# Patient Record
Sex: Male | Born: 2000 | Race: White | Hispanic: No | Marital: Single | Smoking: Never smoker
Health system: Southern US, Community
[De-identification: ages and names within clinical notes are randomized; demographics above are authoritative.]

## PROBLEM LIST (undated history)

## (undated) DIAGNOSIS — R51 Headache: Secondary | ICD-10-CM

## (undated) HISTORY — DX: Headache: R51

---
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
## 2020-10-18 ENCOUNTER — Ambulatory Visit
Admission: EM | Admit: 2020-10-18 | Discharge: 2020-10-18 | Disposition: A | Discharge status: Home / Self Care | Payer: Medicaid Other | Attending: Emergency Medicine | Admitting: Emergency Medicine

## 2020-10-18 ENCOUNTER — Other Ambulatory Visit: Payer: Self-pay

## 2020-10-18 DIAGNOSIS — J069 Acute upper respiratory infection, unspecified: Secondary | ICD-10-CM | POA: Diagnosis not present

## 2020-10-18 MED ORDER — FLUTICASONE PROPIONATE 50 MCG/ACT NA SUSP: 2.0000 | Freq: Every day | NASAL | 0 refills | Status: DC

## 2020-10-18 NOTE — Discharge Instructions (Addendum)
start an antihistamine/decongestant combination such as Claritin-D, Allegra-D, Zyrtec-D, Flonase, saline nasal irrigation with a Lloyd Huger med rinse and distilled water as often as you want.  This will help reduce the postnasal drip and nasal congestion.  Follow-up with a primary care physician of your choice.  See list below.  If you change your mind about Covid testing, feel free to return here and we can do it at that time.  Here is a list of primary care providers who are taking new patients:  Dr. Elizabeth Sauer [address] [place] [address] [phone]  [facility] [address] [place], [place] [address] [phone]  [facility] [place] [address] [place] [address]  [phone]  [facility] [address] [place], [place] [address] [phone]  [facility] [address] [place]  [phone] [place], [place] [address]  Here are clinics/ other resources who will see you if you do not have insurance. Some have certain criteria that you must meet. Call them and find out what they are:  [facility]: [address] [place], [place] [address] Phone: [phone] Hours: First and Third Saturdays of each Month, 9 a.m. - 1 p.m.  [facility]: [address], [address] [place], [place] [address] Phone: [phone] Hours: Tuesday, 4 p.m. - 8 p.m. Thursday, 1 p.m. - 8 p.m. Wednesday, 9 a.m. - [facility] [address], [place], [place] [address] Phone: [phone] Pharmacy Phone Number: [phone] Dental Phone Number: [phone] [facility] Insurance Help: [phone]  Dental Hours: Monday - Thursday, 8 a.m. - 6 p.m.  [facility] [address], [place], [place] [address] Phone: [phone] Pharmacy Phone Number: [phone] [facility] Insurance Help: [phone]  [facility] [address] [place] [place], [place] [address] Phone:  [phone] Pharmacy Phone Number: [phone] [facility] Insurance Help: [phone]  [facility] [address] [place], [place] [address] Phone: [phone] [facility] Insurance Help: [phone]   [facility] [address] [place], [place] [address] Phone: [phone]  Go to www.goodrx.com to look up your medications. This will give you a list of where you can find your prescriptions at the most affordable prices. Or ask the pharmacist what the cash price is, or if they have any other discount programs available to help make your medication more affordable. This can be less expensive than what you would pay with insurance.  Marland Kitchen

## 2020-10-18 NOTE — ED Triage Notes (Signed)
Pt cough productive cough with green sputum, congestion, runny nose for approx 4 days.   Denies SOB, sore throat, body aches, n/v/d, abdominal pain, loss of taste/smell. No OTC meds taken for symptoms. Pt declines covid testing.

## 2020-10-18 NOTE — ED Provider Notes (Addendum)
HPI  SUBJECTIVE:  Marc Berry is a 19 y.o. male who presents with clear nasal congestion, postnasal drip, sore throat secondary to the postnasal drip for the past 3 to 4 days.  He reports a cough because of the postnasal drip and sinus pressure when blowing his nose.  There is no other sinus pain or pressure.  No fevers, body aches, headaches, loss of sense of smell or taste, shortness of breath, nausea, vomiting, diarrhea, abdominal pain.  No known Covid exposure.  He did not get the Covid vaccine.  No allergy type symptoms, but he states that he gets these symptoms around this time every year and it usually responds well to an unknown allergy pill.  No facial swelling, upper dental pain.  No antibiotics in the past month. no Antipyretic in the past 6 hours.  He tried Advil 400 mg with improvement in his symptoms.  No aggravating factors.  He has a past medical history of seasonal allergies.  No history of frequent sinusitis, Covid, diabetes, hypertension.  PMD: None.    Past Medical History:  Diagnosis Date  . Headache(784.0)     History reviewed. No pertinent surgical history.  History reviewed. No pertinent family history.  Social History   Tobacco Use  . Smoking status: Never Smoker  . Smokeless tobacco: Never Used  Vaping Use  . Vaping Use: Never used  Substance Use Topics  . Alcohol use: No  . Drug use: No    No current facility-administered medications for this encounter.  Current Outpatient Medications:  .  fluticasone (FLONASE) 50 MCG/ACT nasal spray, Place 2 sprays into both nostrils daily., Disp: 16 g, Rfl: 0 .  hydrocortisone 2.5 % cream, Apply topically 3 (three) times daily., Disp: 30 g, Rfl: 0 .  methylphenidate (METADATE CD) 30 MG CR capsule, Take 30 mg by mouth every morning., Disp: , Rfl:   No Known Allergies   ROS  As noted in HPI.   Physical Exam  BP (!) 141/92 (BP Location: Left Arm)   Pulse 81   Temp 99.5 F (37.5 C) (Oral)   Resp 16   SpO2  98%   Constitutional: Well developed, well nourished, no acute distress Eyes:  EOMI, conjunctiva normal bilaterally HENT: Normocephalic, atraumatic,mucus membranes moist.  Swollen, erythematous turbinates.  Extensive clear nasal congestion.  No maxillary, frontal sinus tenderness.  No obvious postnasal drip.  Normal oropharynx. Respiratory: Normal inspiratory effort, lungs clear bilaterally, good air movement. Cardiovascular: Normal rate regular rhythm no murmurs rubs or gallops GI: nondistended skin: No rash, skin intact Musculoskeletal: no deformities Neurologic: Alert & oriented x 3, no focal neuro deficits Psychiatric: Speech and behavior appropriate   ED Course   Medications - No data to display  No orders of the defined types were placed in this encounter.   No results found for this or any previous visit (from the past 24 hour(s)). No results found.  ED Clinical Impression  1. Upper respiratory tract infection, unspecified type      ED Assessment/Plan  Patient states that this happens every year, around this time of the year/with change in seasons and that an antihistamine usually works.  Advised him to start on antihistamine/decongestant combination such as Claritin-D, Allegra-D, Zyrtec-D.  Will start Flonase, saline nasal irrigation.  No indications for antibiotics at this time.  Discussed that with patient.  Also encouraged patient to get a Covid test. Discussed with him that it would change management that he would be a candidate for monoclonal  antibody infusion based on BMI, and that it would keep other people safe, but patient declined.  Will give primary care list for ongoing care.   Discussed , MDM, treatment plan, and plan for follow-up with patient. patient agrees with plan.   Meds ordered this encounter  Medications  . fluticasone (FLONASE) 50 MCG/ACT nasal spray    Sig: Place 2 sprays into both nostrils daily.    Dispense:  16 g    Refill:  0    *This  clinic note was created using Scientist, clinical (histocompatibility and immunogenetics). Therefore, there may be occasional mistakes despite careful proofreading.   ?    Domenick Gong, MD 10/18/20 6767    Domenick Gong, MD 10/18/20 [phone]

## 2020-10-23 ENCOUNTER — Encounter: Payer: Self-pay | Admitting: Emergency Medicine

## 2020-10-23 ENCOUNTER — Other Ambulatory Visit: Payer: Self-pay

## 2020-10-23 ENCOUNTER — Ambulatory Visit
Admission: EM | Admit: 2020-10-23 | Discharge: 2020-10-23 | Disposition: A | Discharge status: Home / Self Care | Payer: Medicaid Other | Attending: Family Medicine | Admitting: Family Medicine

## 2020-10-23 DIAGNOSIS — J310 Chronic rhinitis: Secondary | ICD-10-CM | POA: Insufficient documentation

## 2020-10-23 DIAGNOSIS — J36 Peritonsillar abscess: Secondary | ICD-10-CM | POA: Diagnosis not present

## 2020-10-23 DIAGNOSIS — Z79899 Other long term (current) drug therapy: Secondary | ICD-10-CM | POA: Diagnosis not present

## 2020-10-23 DIAGNOSIS — J029 Acute pharyngitis, unspecified: Secondary | ICD-10-CM | POA: Diagnosis present

## 2020-10-23 DIAGNOSIS — J039 Acute tonsillitis, unspecified: Secondary | ICD-10-CM | POA: Diagnosis not present

## 2020-10-23 DIAGNOSIS — Z20822 Contact with and (suspected) exposure to covid-19: Secondary | ICD-10-CM | POA: Diagnosis not present

## 2020-10-23 LAB — SARS CORONAVIRUS 2 AG (30 MIN TAT): SARS Coronavirus 2 Ag: NEGATIVE

## 2020-10-23 LAB — GROUP A STREP BY PCR: Group A Strep by PCR: NOT DETECTED

## 2020-10-23 MED ORDER — METHYLPREDNISOLONE 4 MG PO TBPK: ORAL_TABLET | ORAL | 0 refills | Status: DC

## 2020-10-23 MED ORDER — AMOXICILLIN-POT CLAVULANATE 875-125 MG PO TABS: 1.0000 | ORAL_TABLET | Freq: Two times a day (BID) | ORAL | 0 refills | Status: DC

## 2020-10-23 NOTE — Discharge Instructions (Addendum)
You should improve in the next 48-72h. If you get worse, go to ER since you may have to see the on call Ear Nose and throat doctor.

## 2020-10-23 NOTE — ED Provider Notes (Signed)
[facility]    CSN: [id number] Arrival date & time: 10/23/20  1832      History   Chief Complaint Chief Complaint  Patient presents with  . Sore Throat  . Nasal Congestion    HPI Marc Berry is a 19 y.o. male who presents with persistent ST, but worse on the L than the R. His nose congestion is better with Flonase. Has been having discolored nose congestion and PND x 1 month. Yesterday he brew green mucous from his nose. He was chilling and sweating last night and did not know he had a fever today. Has been tired. Has mild cough.     Past Medical History:  Diagnosis Date  . Headache(784.0)     There are no problems to display for this patient.   History reviewed. No pertinent surgical history.     Home Medications    Prior to Admission medications   Medication Sig Start Date End Date Taking? Authorizing Provider  fluticasone (FLONASE) 50 MCG/ACT nasal spray Place 2 sprays into both nostrils daily. 10/18/20  Yes Domenick Gong, MD  amoxicillin-clavulanate (AUGMENTIN) 875-125 MG tablet Take 1 tablet by mouth every 12 (twelve) hours. 10/23/20   Rodriguez-Southworth, Nettie Elm, PA-C  methylPREDNISolone (MEDROL DOSEPAK) 4 MG TBPK tablet Take as directed 10/23/20   Rodriguez-Southworth, Nettie Elm, PA-C  methylphenidate (METADATE CD) 30 MG CR capsule Take 30 mg by mouth every morning.  10/23/20  [provider]  metoCLOPramide (REGLAN) 10 MG tablet Take 1 tablet (10 mg total) by mouth every 6 (six) hours as needed for nausea. 07/26/15 10/18/20  Charlynne Pander, MD  SUMAtriptan (IMITREX) 50 MG tablet Take 50 mg by mouth. Take one at onset of migraine with 400 mg of ibuprofen may repeat in 2 hours if migraine persists  10/18/20  [provider]    Family History History reviewed. No pertinent family history.  Social History Social History   Tobacco Use  . Smoking status: Never Smoker  . Smokeless tobacco: Never Used  Vaping Use  .  Vaping Use: Never used  Substance Use Topics  . Alcohol use: No  . Drug use: No     Allergies   Patient has no known allergies.   Review of Systems Review of Systems  Constitutional: Positive for chills, diaphoresis, fatigue and fever. Negative for appetite change.  HENT: Positive for postnasal drip, sore throat and trouble swallowing. Negative for congestion, ear discharge, ear pain, mouth sores, rhinorrhea and sinus pressure.   Eyes: Negative for discharge.  Respiratory: Positive for cough. Negative for shortness of breath and wheezing.   Gastrointestinal: Negative for abdominal pain.  Musculoskeletal: Negative for gait problem, joint swelling and myalgias.  Skin: Negative for rash.  Neurological: Positive for headaches.  Hematological: Positive for adenopathy.   Physical Exam Triage Vital Signs ED Triage Vitals  Enc Vitals Group     BP 10/23/20 1847 (!) 133/100     Pulse Rate 10/23/20 1847 100     Resp 10/23/20 1847 18     Temp 10/23/20 1847 (!) 101 F (38.3 C)     Temp Source 10/23/20 1847 Oral     SpO2 10/23/20 1847 100 %     Weight 10/23/20 1844 250 lb (113.4 kg)     Height 10/23/20 1844 6' (1.829 m)     Head Circumference --      Peak Flow --      Pain Score 10/23/20 1844 10     Pain Loc --  Pain Edu? --      Excl. in GC? --    No data found.  Updated Vital Signs BP (!) 133/100 (BP Location: Left Arm)   Pulse 100   Temp (!) 101 F (38.3 C) (Oral)   Resp 18   Ht 6' (1.829 m)   Wt 250 lb (113.4 kg)   SpO2 100%   BMI 33.91 kg/m   Visual Acuity Right Eye Distance:   Left Eye Distance:   Bilateral Distance:    Right Eye Near:   Left Eye Near:    Bilateral Near:     Physical Exam Vitals and nursing note reviewed.  Constitutional:      General: He is not in acute distress.    Appearance: He is obese. He is not toxic-appearing.  HENT:     Head: Normocephalic.     Right Ear: Tympanic membrane and ear canal normal.     Left Ear: Tympanic  membrane and ear canal normal.     Mouth/Throat:     Mouth: Mucous membranes are moist.     Pharynx: Oropharyngeal exudate and posterior oropharyngeal erythema present.     Tonsils: Tonsillar exudate and tonsillar abscess present. 3+ on the right. 3+ on the left.     Comments: Has mild uvula deviation Eyes:     Conjunctiva/sclera: Conjunctivae normal.  Cardiovascular:     Rate and Rhythm: Normal rate and regular rhythm.     Heart sounds: No murmur heard.   Pulmonary:     Effort: Pulmonary effort is normal.     Breath sounds: Normal breath sounds.  Musculoskeletal:     Cervical back: Neck supple.  Lymphadenopathy:     Cervical: Cervical adenopathy present.  Skin:    General: Skin is warm and dry.     Findings: No rash.  Neurological:     Mental Status: He is alert and oriented to person, place, and time.  Psychiatric:        Mood and Affect: Mood normal.        Behavior: Behavior normal.     UC Treatments / Results  Labs (all labs ordered are listed, but only abnormal results are displayed) Labs Reviewed  GROUP A STREP BY PCR  SARS CORONAVIRUS 2 AG (30 MIN TAT)  Rapid Covid and Strep are negative  EKG   Radiology No results found.  Procedures Procedures (including critical care time)  Medications Ordered in UC Medications - No data to display  Initial Impression / Assessment and Plan / UC Course  I have reviewed the triage vital signs and the nursing notes. He has exudative tonsillitis and possibly early peritonsillar abscess on the L I palced him on Medrol dose pack and Augmentin as noted. See instructions.  Pertinent labs  results that were available during my care of the patient were reviewed by me and considered in my medical decision making (see chart for details).   Final Clinical Impressions(s) / UC Diagnoses   Final diagnoses:  Exudative tonsillitis  Peritonsillar abscess  Purulent rhinitis     Discharge Instructions     You should improve in  the next 48-72h. If you get worse, go to ER since you may have to see the on call Ear Nose and throat doctor.     ED Prescriptions    Medication Sig Dispense Auth. Provider   amoxicillin-clavulanate (AUGMENTIN) 875-125 MG tablet Take 1 tablet by mouth every 12 (twelve) hours. 20 tablet Rodriguez-Southworth, Nettie Elm, PA-C   methylPREDNISolone (MEDROL  DOSEPAK) 4 MG TBPK tablet Take as directed 21 tablet Rodriguez-Southworth, Nettie Elm, PA-C     PDMP not reviewed this encounter.   Garey Ham, [place] 10/23/20 1953

## 2020-10-23 NOTE — ED Triage Notes (Signed)
Patient c/o sore throat that started last night. He states he was seen 4 days ago for nasal congestion and cough which has improved.

## 2020-10-25 ENCOUNTER — Ambulatory Visit
Admission: EM | Admit: 2020-10-25 | Discharge: 2020-10-25 | Disposition: A | Discharge status: Home / Self Care | Payer: Medicaid Other | Attending: Family Medicine | Admitting: Family Medicine

## 2020-10-25 ENCOUNTER — Other Ambulatory Visit: Payer: Self-pay

## 2020-10-25 DIAGNOSIS — J029 Acute pharyngitis, unspecified: Secondary | ICD-10-CM

## 2020-10-25 MED ORDER — KETOROLAC TROMETHAMINE 10 MG PO TABS: 10.0000 mg | ORAL_TABLET | Freq: Four times a day (QID) | ORAL | 0 refills | Status: DC | PRN

## 2020-10-25 MED ORDER — LIDOCAINE VISCOUS HCL 2 % MT SOLN: OROMUCOSAL | 0 refills | Status: DC

## 2020-10-25 NOTE — ED Triage Notes (Signed)
Pt reports being seen 10/26 for sore throat. Strep test resulted negative. Medications were prescribed.

## 2020-10-25 NOTE — Discharge Instructions (Signed)
Rest.  Lots of fluids.  Take medications as prescribed.  If you worsen, go to the ER.  Take care  Dr. Adriana Simas

## 2020-10-25 NOTE — ED Provider Notes (Signed)
[facility]    CSN: [id number] Arrival date & time: 10/25/20  1503      History   Chief Complaint Chief Complaint  Patient presents with  . Sore Throat    HPI  19 year old male presents with sore throat.  Patient reports persistent sore throat.  Patient was seen on 10/26.  Strep PCR was negative.  He was placed on Augmentin and steroids for tonsillitis.  He states that he has failed to improve.  Pain 8/10 in severity.  No relieving factors.  He has been febrile but is currently afebrile.  Covid testing was negative as well.  Patient is concerned that he has strep pharyngitis would like to discuss testing today.  No other reported symptoms.  No other complaints.  Past Medical History:  Diagnosis Date  . Headache(784.0)    Home Medications    Prior to Admission medications   Medication Sig Start Date End Date Taking? Authorizing Provider  amoxicillin-clavulanate (AUGMENTIN) 875-125 MG tablet Take 1 tablet by mouth every 12 (twelve) hours. 10/23/20   Rodriguez-Southworth, Nettie Elm, PA-C  fluticasone (FLONASE) 50 MCG/ACT nasal spray Place 2 sprays into both nostrils daily. 10/18/20   Domenick Gong, MD  ketorolac (TORADOL) 10 MG tablet Take 1 tablet (10 mg total) by mouth every 6 (six) hours as needed for moderate pain or severe pain. 10/25/20   Tommie Sams, DO  lidocaine (XYLOCAINE) 2 % solution Gargle 15 mL every 3 hours as needed for sore throat. May swallow if desired. 10/25/20   Tommie Sams, DO  methylPREDNISolone (MEDROL DOSEPAK) 4 MG TBPK tablet Take as directed 10/23/20   Rodriguez-Southworth, Nettie Elm, PA-C  methylphenidate (METADATE CD) 30 MG CR capsule Take 30 mg by mouth every morning.  10/23/20  [provider]  metoCLOPramide (REGLAN) 10 MG tablet Take 1 tablet (10 mg total) by mouth every 6 (six) hours as needed for nausea. 07/26/15 10/18/20  Charlynne Pander, MD  SUMAtriptan (IMITREX) 50 MG tablet Take 50 mg by mouth. Take one at onset of  migraine with 400 mg of ibuprofen may repeat in 2 hours if migraine persists  10/18/20  [provider]    Family History History reviewed. No pertinent family history.  Social History Social History   Tobacco Use  . Smoking status: Never Smoker  . Smokeless tobacco: Never Used  Vaping Use  . Vaping Use: Never used  Substance Use Topics  . Alcohol use: No  . Drug use: No     Allergies   Patient has no known allergies.   Review of Systems Review of Systems  Constitutional: Positive for fever.  HENT: Positive for sore throat.    Physical Exam Triage Vital Signs ED Triage Vitals  Enc Vitals Group     BP 10/25/20 1510 (!) 132/97     Pulse Rate 10/25/20 1510 89     Resp 10/25/20 1510 16     Temp 10/25/20 1510 98.7 F (37.1 C)     Temp Source 10/25/20 1510 Oral     SpO2 10/25/20 1510 100 %     Weight 10/25/20 1514 250 lb (113.4 kg)     Height --      Head Circumference --      Peak Flow --      Pain Score 10/25/20 1510 8     Pain Loc --      Pain Edu? --      Excl. in GC? --    Updated Vital Signs BP Marland Kitchen)  132/97 (BP Location: Left Arm)   Pulse 89   Temp 98.7 F (37.1 C) (Oral)   Resp 16   Wt 113.4 kg   SpO2 100%   BMI 33.91 kg/m   Visual Acuity Right Eye Distance:   Left Eye Distance:   Bilateral Distance:    Right Eye Near:   Left Eye Near:    Bilateral Near:     Physical Exam Vitals and nursing note reviewed.  Constitutional:      Appearance: He is well-developed. He is obese.  HENT:     Head: Normocephalic and atraumatic.     Mouth/Throat:     Pharynx: Uvula midline. Posterior oropharyngeal erythema present.     Tonsils: Tonsillar exudate present. 2+ on the right. 2+ on the left.  Cardiovascular:     Rate and Rhythm: Normal rate and regular rhythm.     Heart sounds: No murmur heard.   Neurological:     Mental Status: He is alert.    UC Treatments / Results  Labs (all labs ordered are listed, but only abnormal results are  displayed) Labs Reviewed - No data to display  EKG   Radiology No results found.  Procedures Procedures (including critical care time)  Medications Ordered in UC Medications - No data to display  Initial Impression / Assessment and Plan / UC Course  I have reviewed the triage vital signs and the nursing notes.  Pertinent labs & imaging results that were available during my care of the patient were reviewed by me and considered in my medical decision making (see chart for details).    19 year old male presents with viral pharyngitis.  Discussed lack of need to repeat strep testing.  Advised to complete antibiotic course.  I suspect that this is viral and most likely due to infectious mononucleosis.  Discussed testing and patient elected to just be treated symptomatically.  Treating with viscous lidocaine and Toradol.  Supportive care.  Final Clinical Impressions(s) / UC Diagnoses   Final diagnoses:  Viral pharyngitis     Discharge Instructions     Rest.  Lots of fluids.  Take medications as prescribed.  If you worsen, go to the ER.  Take care  Dr. Adriana Simas     ED Prescriptions    Medication Sig Dispense Auth. Provider   lidocaine (XYLOCAINE) 2 % solution Gargle 15 mL every 3 hours as needed for sore throat. May swallow if desired. 200 mL Shailen Thielen G, DO   ketorolac (TORADOL) 10 MG tablet Take 1 tablet (10 mg total) by mouth every 6 (six) hours as needed for moderate pain or severe pain. 20 tablet Tommie Sams, DO     PDMP not reviewed this encounter.   Tommie Sams, [place] 10/25/20 1725

## 2020-12-26 ENCOUNTER — Ambulatory Visit
Admission: EM | Admit: 2020-12-26 | Discharge: 2020-12-26 | Disposition: A | Discharge status: Home / Self Care | Payer: Medicaid Other | Attending: Family Medicine | Admitting: Family Medicine

## 2020-12-26 ENCOUNTER — Other Ambulatory Visit: Payer: Self-pay

## 2020-12-26 DIAGNOSIS — U071 COVID-19: Secondary | ICD-10-CM | POA: Diagnosis not present

## 2020-12-26 LAB — RESP PANEL BY RT-PCR (FLU A&B, COVID) ARPGX2
Influenza A by PCR: NEGATIVE
Influenza B by PCR: NEGATIVE
SARS Coronavirus 2 by RT PCR: POSITIVE — AB

## 2020-12-26 MED ORDER — IPRATROPIUM BROMIDE 0.06 % NA SOLN: 2.0000 | Freq: Four times a day (QID) | NASAL | 0 refills | Status: AC | PRN

## 2020-12-26 MED ORDER — NAPROXEN 500 MG PO TABS: 500.0000 mg | ORAL_TABLET | Freq: Two times a day (BID) | ORAL | 0 refills | Status: AC | PRN

## 2020-12-26 NOTE — ED Triage Notes (Signed)
Pt reports nasal congestion, body aches esp legs and back, and painful breathing x 1 day.

## 2020-12-26 NOTE — ED Provider Notes (Signed)
[facility]    CSN: [id number] Arrival date & time: 12/26/20  1137      History   Chief Complaint Chief Complaint  Patient presents with  . Generalized Body Aches   HPI  19 year old male presents with body aches, runny nose, congestion.  Started yesterday.  He reports congestion, body aches, runny nose.  No fever.  Denies cough.  Denies shortness of breath to me.  Pain 4/10 in severity.  No relieving factors.  No other associated symptoms.  No other complaints.  Past Medical History:  Diagnosis Date  . Headache(784.0)    Home Medications    Prior to Admission medications   Medication Sig Start Date End Date Taking? Authorizing Provider  ipratropium (ATROVENT) 0.06 % nasal spray Place 2 sprays into both nostrils 4 (four) times daily as needed for rhinitis. 12/26/20  Yes Olga Bourbeau G, DO  naproxen (NAPROSYN) 500 MG tablet Take 1 tablet (500 mg total) by mouth 2 (two) times daily as needed for moderate pain. 12/26/20  Yes Kierria Feigenbaum G, DO  fluticasone (FLONASE) 50 MCG/ACT nasal spray Place 2 sprays into both nostrils daily. 10/18/20 12/26/20  Domenick Gong, MD  methylphenidate (METADATE CD) 30 MG CR capsule Take 30 mg by mouth every morning.  10/23/20  [provider]  metoCLOPramide (REGLAN) 10 MG tablet Take 1 tablet (10 mg total) by mouth every 6 (six) hours as needed for nausea. 07/26/15 10/18/20  Charlynne Pander, MD  SUMAtriptan (IMITREX) 50 MG tablet Take 50 mg by mouth. Take one at onset of migraine with 400 mg of ibuprofen may repeat in 2 hours if migraine persists  10/18/20  [provider]   Social History Social History   Tobacco Use  . Smoking status: Never Smoker  . Smokeless tobacco: Never Used  Vaping Use  . Vaping Use: Every day  . Substances: Nicotine, Flavoring  Substance Use Topics  . Alcohol use: No  . Drug use: No     Allergies   Patient has no known allergies.   Review of Systems Review of Systems   Constitutional: Negative for fever.  HENT: Positive for congestion and rhinorrhea.   Musculoskeletal:       Body aches.   Physical Exam Triage Vital Signs ED Triage Vitals  Enc Vitals Group     BP 12/26/20 1424 138/83     Pulse Rate 12/26/20 1424 86     Resp 12/26/20 1424 18     Temp 12/26/20 1424 98.9 F (37.2 C)     Temp Source 12/26/20 1424 Oral     SpO2 12/26/20 1424 99 %     Weight --      Height --      Head Circumference --      Peak Flow --      Pain Score 12/26/20 1324 4     Pain Loc --      Pain Edu? --      Excl. in GC? --    No data found.  Updated Vital Signs BP 138/83 (BP Location: Left Arm)   Pulse 86   Temp 98.9 F (37.2 C) (Oral)   Resp 18   SpO2 99%   Visual Acuity Right Eye Distance:   Left Eye Distance:   Bilateral Distance:    Right Eye Near:   Left Eye Near:    Bilateral Near:     Physical Exam Vitals and nursing note reviewed.  Constitutional:      General:  He is not in acute distress.    Appearance: Normal appearance. He is obese. He is not ill-appearing.  HENT:     Head: Normocephalic and atraumatic.  Eyes:     General:        Right eye: No discharge.        Left eye: No discharge.     Conjunctiva/sclera: Conjunctivae normal.  Cardiovascular:     Rate and Rhythm: Normal rate and regular rhythm.  Pulmonary:     Effort: Pulmonary effort is normal.     Breath sounds: Normal breath sounds. No wheezing, rhonchi or rales.  Neurological:     Mental Status: He is alert.  Psychiatric:        Mood and Affect: Mood normal.        Behavior: Behavior normal.    UC Treatments / Results  Labs (all labs ordered are listed, but only abnormal results are displayed) Labs Reviewed  RESP PANEL BY RT-PCR (FLU A&B, COVID) ARPGX2 - Abnormal; Notable for the following components:      Result Value   SARS Coronavirus 2 by RT PCR POSITIVE (*)    All other components within normal limits    EKG   Radiology No results  found.  Procedures Procedures (including critical care time)  Medications Ordered in UC Medications - No data to display  Initial Impression / Assessment and Plan / UC Course  I have reviewed the triage vital signs and the nursing notes.  Pertinent labs & imaging results that were available during my care of the patient were reviewed by me and considered in my medical decision making (see chart for details).    19 year old male presents with COVID-19.  Overall well-appearing.  After nasal spray and naproxen as directed.  Supportive care.  Final Clinical Impressions(s) / UC Diagnoses   Final diagnoses:  COVID     Discharge Instructions     We will call with the results.  Medication as prescribed.  Take care  Dr. Adriana Simas     ED Prescriptions    Medication Sig Dispense Auth. Provider   ipratropium (ATROVENT) 0.06 % nasal spray Place 2 sprays into both nostrils 4 (four) times daily as needed for rhinitis. 15 mL Jamareon Shimel G, DO   naproxen (NAPROSYN) 500 MG tablet Take 1 tablet (500 mg total) by mouth 2 (two) times daily as needed for moderate pain. 30 tablet Tommie Sams, DO     PDMP not reviewed this encounter.   Tommie Sams, [place] 12/26/20 1548

## 2020-12-26 NOTE — Discharge Instructions (Signed)
We will call with the results. ? ?Medication as prescribed. ? ?Take care ? ?Dr. Sirius Woodford  ?

## 2021-08-09 ENCOUNTER — Ambulatory Visit
Admission: RE | Admit: 2021-08-09 | Discharge: 2021-08-09 | Disposition: A | Discharge status: Home / Self Care | Payer: Medicaid Other | Attending: Family Medicine | Admitting: Family Medicine

## 2021-08-09 ENCOUNTER — Other Ambulatory Visit: Payer: Self-pay | Admitting: Family Medicine

## 2021-08-09 ENCOUNTER — Ambulatory Visit
Admission: RE | Admit: 2021-08-09 | Discharge: 2021-08-09 | Disposition: A | Discharge status: Home / Self Care | Payer: Medicaid Other | Source: Ambulatory Visit | Attending: Family Medicine | Admitting: Family Medicine

## 2021-08-09 DIAGNOSIS — S4992XA Unspecified injury of left shoulder and upper arm, initial encounter: Secondary | ICD-10-CM | POA: Diagnosis present

## 2021-08-09 DIAGNOSIS — M25512 Pain in left shoulder: Secondary | ICD-10-CM | POA: Diagnosis not present

## 2021-08-09 DIAGNOSIS — W19XXXA Unspecified fall, initial encounter: Secondary | ICD-10-CM | POA: Insufficient documentation

## 2022-10-20 ENCOUNTER — Encounter: Payer: Self-pay | Admitting: Emergency Medicine

## 2022-10-20 ENCOUNTER — Ambulatory Visit
Admission: EM | Admit: 2022-10-20 | Discharge: 2022-10-20 | Disposition: A | Discharge status: Home / Self Care | Payer: Medicaid Other | Attending: Physician Assistant | Admitting: Physician Assistant

## 2022-10-20 DIAGNOSIS — U071 COVID-19: Secondary | ICD-10-CM | POA: Insufficient documentation

## 2022-10-20 DIAGNOSIS — J029 Acute pharyngitis, unspecified: Secondary | ICD-10-CM | POA: Insufficient documentation

## 2022-10-20 DIAGNOSIS — R0981 Nasal congestion: Secondary | ICD-10-CM | POA: Insufficient documentation

## 2022-10-20 DIAGNOSIS — Z20822 Contact with and (suspected) exposure to covid-19: Secondary | ICD-10-CM | POA: Insufficient documentation

## 2022-10-20 LAB — GROUP A STREP BY PCR: Group A Strep by PCR: NOT DETECTED

## 2022-10-20 LAB — SARS CORONAVIRUS 2 BY RT PCR: SARS Coronavirus 2 by RT PCR: POSITIVE — AB

## 2022-10-20 NOTE — ED Triage Notes (Signed)
Pt c/o sore throat. Started about 2 days ago. He states his girlfriend has covid but she does not have a sore throat. Pt also has nasal congestion and head pressure. Denies fever.

## 2022-10-20 NOTE — ED Provider Notes (Signed)
[facility]    CSN: [id number] Arrival date & time: 10/20/22  1236      History   Chief Complaint Chief Complaint  Patient presents with   Sore Throat    HPI Marc Berry is a 21 y.o. male presenting for approximately 2-day history of fatigue, headaches, sore throat and nasal congestion.  Denies fever, cough, chest pain, breathing difficulty, vomiting or diarrhea.  His girlfriend currently has COVID-19.  He reports she did not really have a sore throat and he does so he wonders if he could have something else.  He has not been taking any medication for his symptoms.  He is otherwise healthy and does not have any chronic medical problems.  No other complaints.  HPI  Past Medical History:  Diagnosis Date   Headache(784.0)     There are no problems to display for this patient.   History reviewed. No pertinent surgical history.     Home Medications    Prior to Admission medications   Medication Sig Start Date End Date Taking? Authorizing Provider  ipratropium (ATROVENT) 0.06 % nasal spray Place 2 sprays into both nostrils 4 (four) times daily as needed for rhinitis. 12/26/20   Coral Spikes, DO  naproxen (NAPROSYN) 500 MG tablet Take 1 tablet (500 mg total) by mouth 2 (two) times daily as needed for moderate pain. 12/26/20   Coral Spikes, DO  fluticasone (FLONASE) 50 MCG/ACT nasal spray Place 2 sprays into both nostrils daily. 10/18/20 12/26/20  Melynda Ripple, MD  methylphenidate (METADATE CD) 30 MG CR capsule Take 30 mg by mouth every morning.  10/23/20  [provider]  metoCLOPramide (REGLAN) 10 MG tablet Take 1 tablet (10 mg total) by mouth every 6 (six) hours as needed for nausea. 07/26/15 10/18/20  Drenda Freeze, MD  SUMAtriptan (IMITREX) 50 MG tablet Take 50 mg by mouth. Take one at onset of migraine with 400 mg of ibuprofen may repeat in 2 hours if migraine persists  10/18/20  [provider]    Family History No family  history on file.  Social History Social History   Tobacco Use   Smoking status: Never   Smokeless tobacco: Never  Vaping Use   Vaping Use: Former   Substances: Nicotine, Flavoring  Substance Use Topics   Alcohol use: No   Drug use: No     Allergies   Patient has no known allergies.   Review of Systems Review of Systems  Constitutional:  Negative for fatigue and fever.  HENT:  Positive for congestion, rhinorrhea and sore throat. Negative for sinus pressure and sinus pain.   Respiratory:  Negative for cough and shortness of breath.   Gastrointestinal:  Negative for abdominal pain, diarrhea, nausea and vomiting.  Musculoskeletal:  Negative for myalgias.  Neurological:  Positive for headaches. Negative for weakness and light-headedness.  Hematological:  Negative for adenopathy.     Physical Exam Triage Vital Signs ED Triage Vitals  Enc Vitals Group     BP      Pulse      Resp      Temp      Temp src      SpO2      Weight      Height      Head Circumference      Peak Flow      Pain Score      Pain Loc      Pain Edu?  Excl. in GC?    No data found.  Updated Vital Signs BP (!) 143/97 (BP Location: Right Arm)   Pulse 99   Temp 99.5 F (37.5 C) (Oral)   Resp 16   Ht 6' (1.829 m)   Wt 250 lb (113.4 kg)   SpO2 98%   BMI 33.91 kg/m       Physical Exam Vitals and nursing note reviewed.  Constitutional:      General: He is not in acute distress.    Appearance: Normal appearance. He is well-developed. He is not ill-appearing.  HENT:     Head: Normocephalic and atraumatic.     Nose: Congestion present.     Mouth/Throat:     Mouth: Mucous membranes are moist.     Pharynx: Oropharynx is clear. Posterior oropharyngeal erythema present.  Eyes:     General: No scleral icterus.    Conjunctiva/sclera: Conjunctivae normal.  Cardiovascular:     Rate and Rhythm: Normal rate and regular rhythm.     Heart sounds: Normal heart sounds.  Pulmonary:      Effort: Pulmonary effort is normal. No respiratory distress.     Breath sounds: Normal breath sounds.  Musculoskeletal:     Cervical back: Neck supple.  Skin:    General: Skin is warm and dry.     Capillary Refill: Capillary refill takes less than 2 seconds.  Neurological:     General: No focal deficit present.     Mental Status: He is alert. Mental status is at baseline.     Motor: No weakness.     Gait: Gait normal.  Psychiatric:        Mood and Affect: Mood normal.        Behavior: Behavior normal.      UC Treatments / Results  Labs (all labs ordered are listed, but only abnormal results are displayed) Labs Reviewed  SARS CORONAVIRUS 2 BY RT PCR - Abnormal; Notable for the following components:      Result Value   SARS Coronavirus 2 by RT PCR POSITIVE (*)    All other components within normal limits  GROUP A STREP BY PCR    EKG   Radiology No results found.  Procedures Procedures (including critical care time)  Medications Ordered in UC Medications - No data to display  Initial Impression / Assessment and Plan / UC Course  I have reviewed the triage vital signs and the nursing notes.  Pertinent labs & imaging results that were available during my care of the patient were reviewed by me and considered in my medical decision making (see chart for details).   21 year old male presents for sore throat, nasal congestion and headaches x2 days.  No fever, cough or breathing difficulty.  Has been exposed to COVID-19 through his girlfriend.  Vitals are stable.  He is overall well-appearing.  On exam he has nasal congestion and erythema posterior pharynx.  Chest clear to auscultation heart regular rate and rhythm.  PCR strep test is negative.  PCR COVID test is positive.  Reviewed current CDC guidelines, isolation protocol and ED precautions.  Supportive care encouraged with use of over-the-counter decongestants, nasal sprays, Tylenol/Motrin.  Reviewed return precautions.   Work note provided.   Final Clinical Impressions(s) / UC Diagnoses   Final diagnoses:  COVID-19  Sore throat  Nasal congestion  Exposure to COVID-19 virus     Discharge Instructions      -Positive COVID test.  You need to isolate 5 days  from symptom onset and wear mask for 5 days.  Care is supportive to treat your symptoms with over-the-counter medications.  URI/COLD SYMPTOMS: Your exam today is consistent with a viral illness. Antibiotics are not indicated at this time. Use medications as directed, including cough syrup, nasal saline, and decongestants. Your symptoms should improve over the next few days and resolve within 7-10 days. Increase rest and fluids. F/u if symptoms worsen or predominate such as sore throat, ear pain, productive cough, shortness of breath, or if you develop high fevers or worsening fatigue over the next several days.       ED Prescriptions   None    PDMP not reviewed this encounter.   Shirlee Latch, PA-C 10/20/22 1506

## 2022-10-20 NOTE — Discharge Instructions (Addendum)
-  Positive COVID test.  You need to isolate 5 days from symptom onset and wear mask for 5 days.  Care is supportive to treat your symptoms with over-the-counter medications.  URI/COLD SYMPTOMS: Your exam today is consistent with a viral illness. Antibiotics are not indicated at this time. Use medications as directed, including cough syrup, nasal saline, and decongestants. Your symptoms should improve over the next few days and resolve within 7-10 days. Increase rest and fluids. F/u if symptoms worsen or predominate such as sore throat, ear pain, productive cough, shortness of breath, or if you develop high fevers or worsening fatigue over the next several days.

## 2023-02-14 IMAGING — CR DG SHOULDER 2+V*L*
3 series · 3 of 3 positions shown · non-contrast
Comparison: None.

CLINICAL DATA: Fall.  Pain.

EXAM:
LEFT SHOULDER - 2+ VIEW

[shoulder grashey]
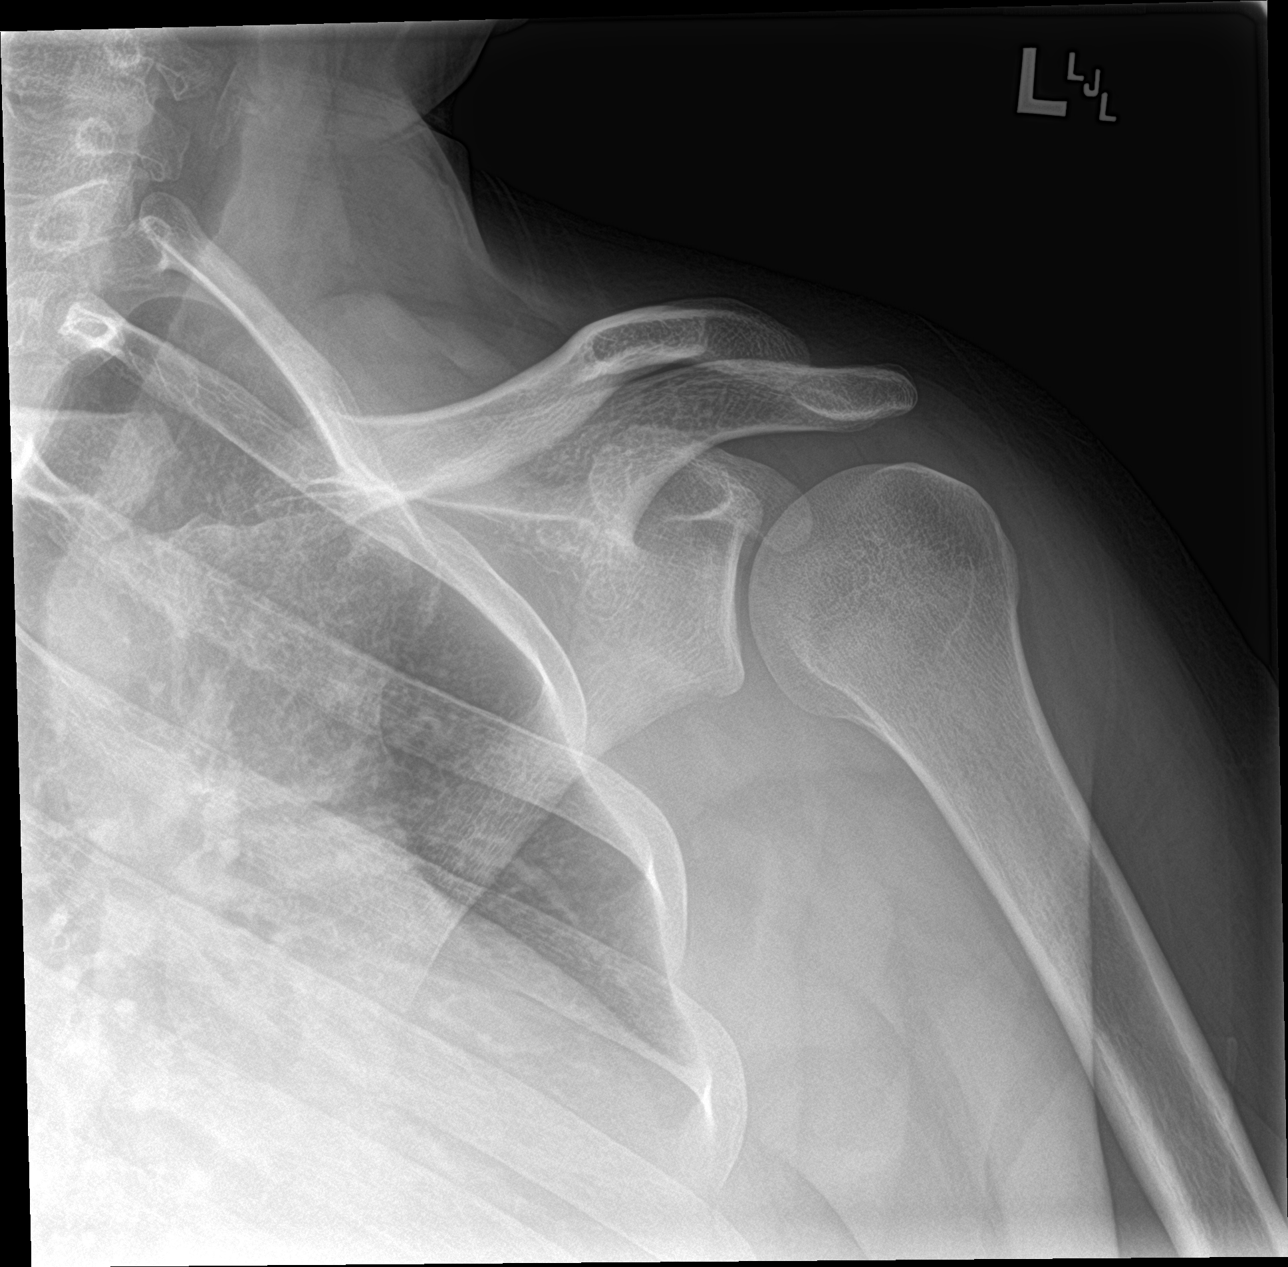

[shoulder y view]
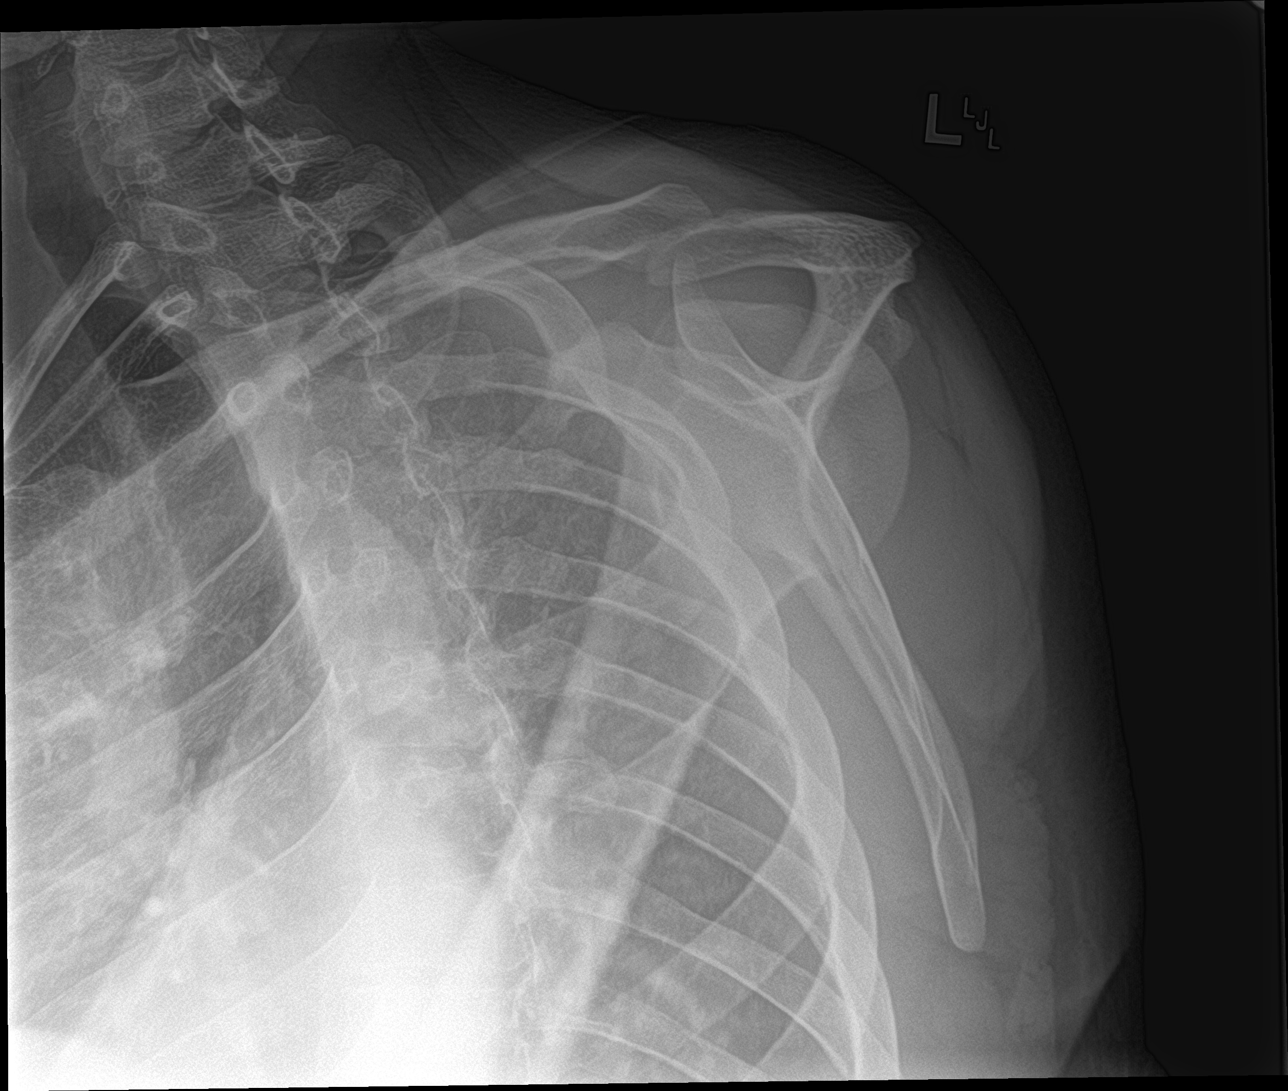

[shoulder axillary]
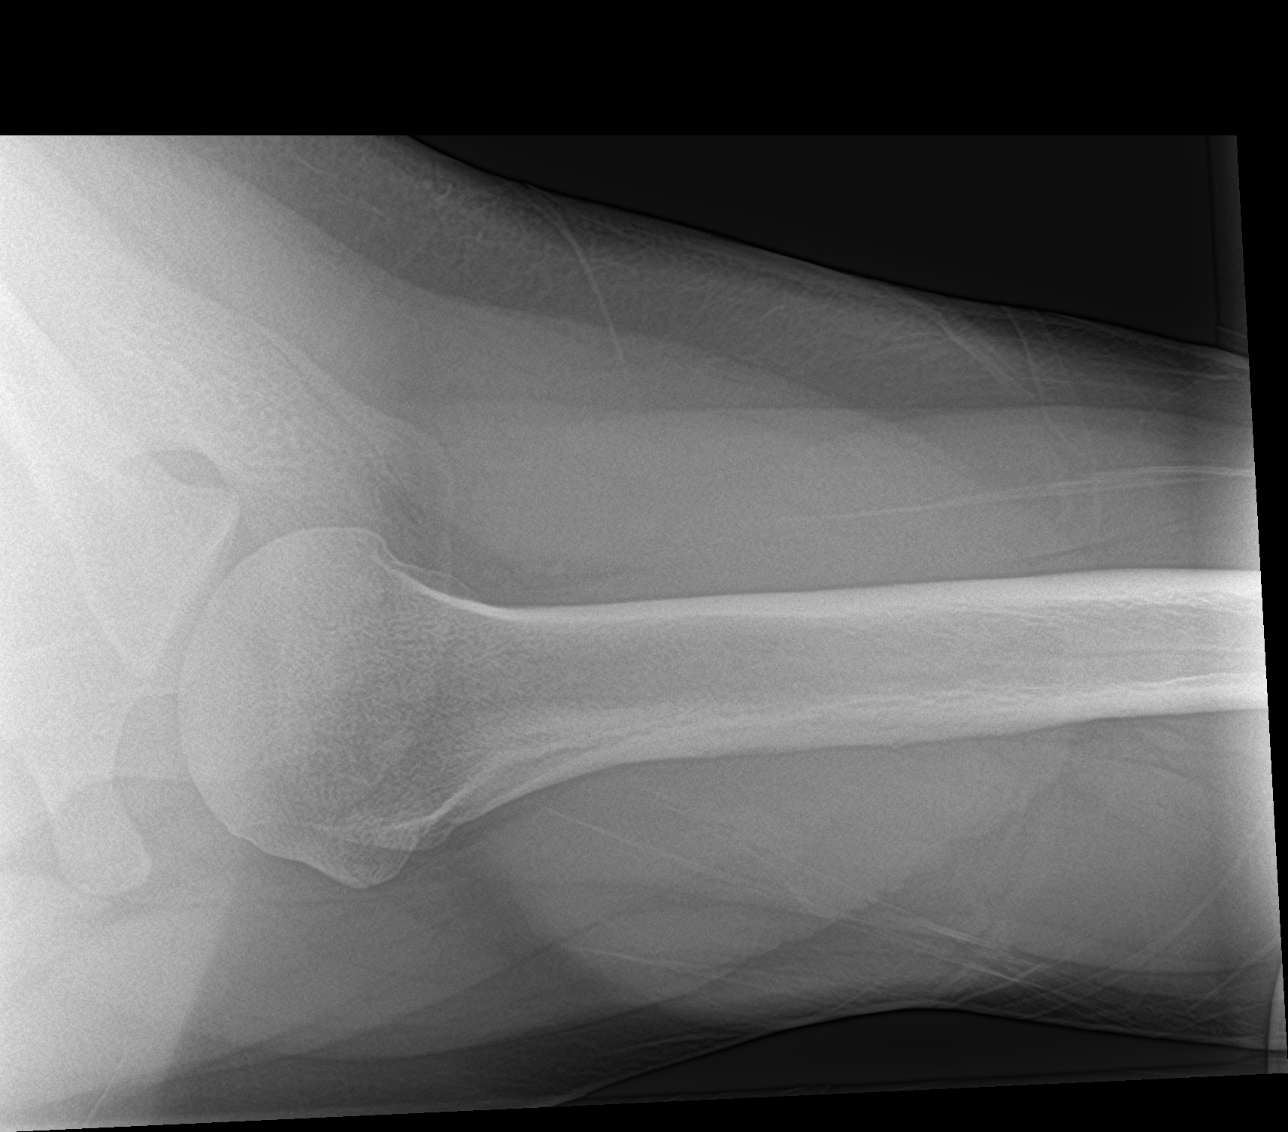

[3 of 3 positions shown; findings below may reference images not displayed]

FINDINGS: There is no evidence of fracture or dislocation. There is no
evidence of arthropathy or other focal bone abnormality. Soft
tissues are unremarkable.
IMPRESSION: Negative.

## 2023-02-14 IMAGING — CR DG OS CALCIS 2+V*L*
2 series · 2 of 2 positions shown · non-contrast
Comparison: None.

CLINICAL DATA: Pain after fall

EXAM:
LEFT OS CALCIS - 2+ VIEW

[calcaneus axial]
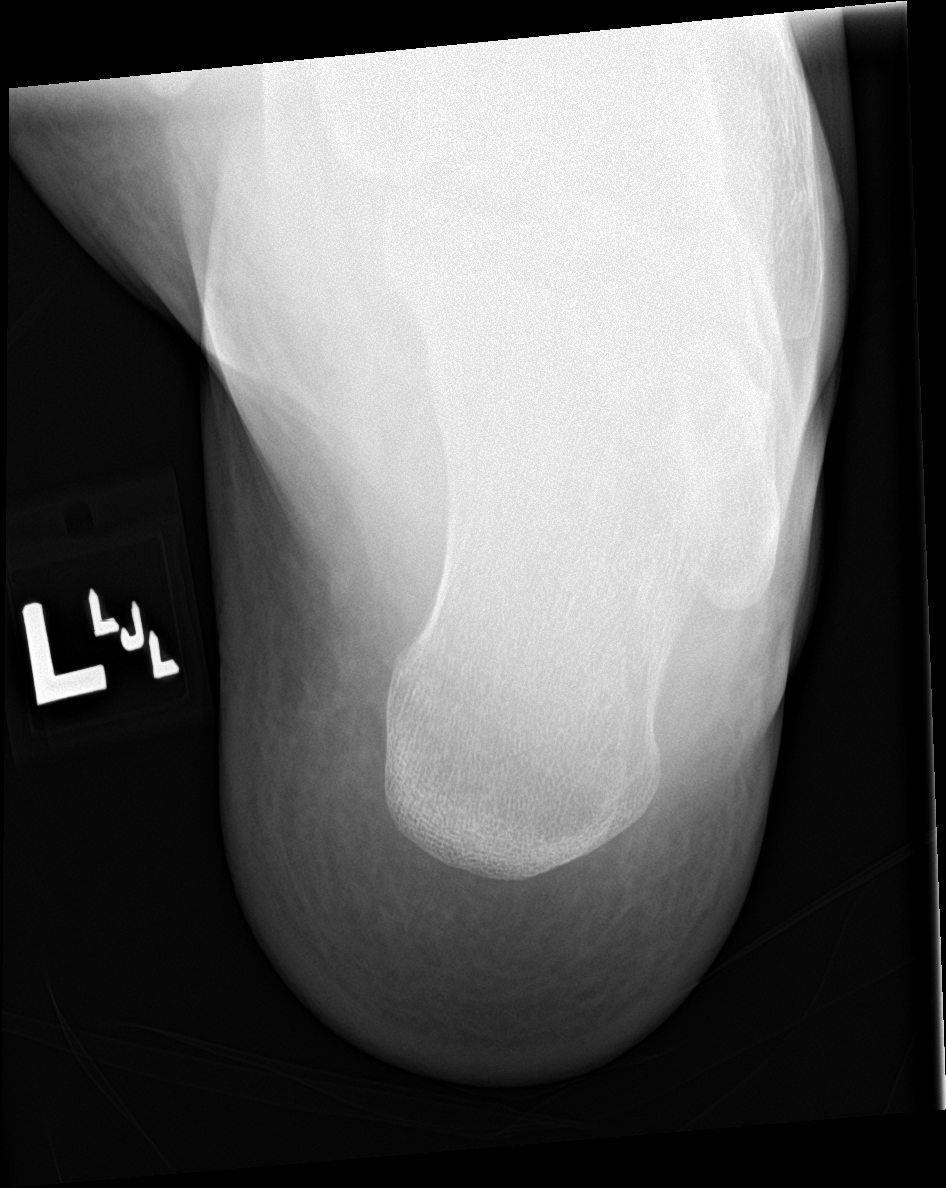

[calcaneus lat]
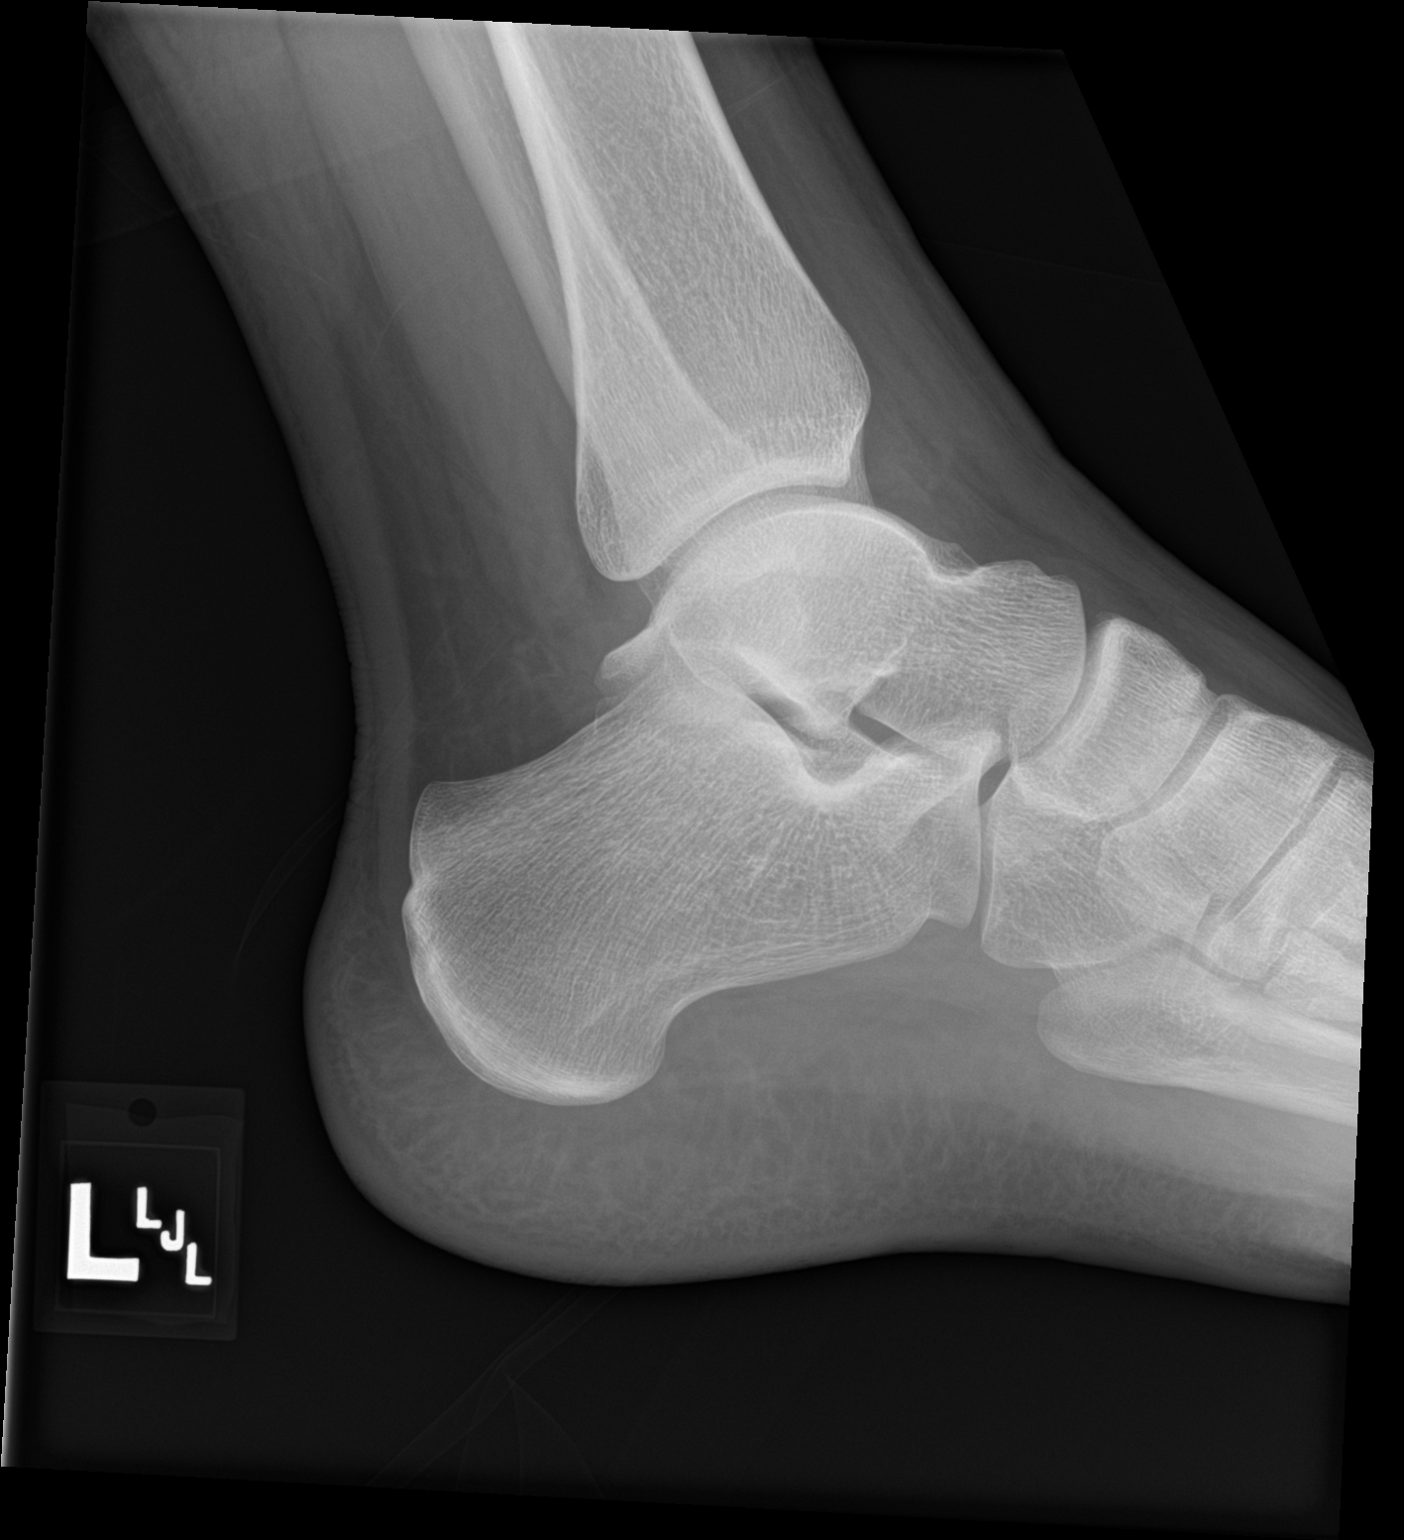

[2 of 2 positions shown; findings below may reference images not displayed]

FINDINGS: There is no evidence of fracture or other focal bone lesions. Soft
tissues are unremarkable.
IMPRESSION: Negative.
